# Patient Record
Sex: Female | Born: 1995 | Race: White | Hispanic: No | Marital: Single | Smoking: Current every day smoker
Health system: Southern US, Community
[De-identification: ages and names within clinical notes are randomized; demographics above are authoritative.]

## PROBLEM LIST (undated history)

## (undated) HISTORY — PX: ANKLE SURGERY: SHX546

---
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
## 2015-01-01 DIAGNOSIS — F439 Reaction to severe stress, unspecified: Secondary | ICD-10-CM

## 2015-01-01 DIAGNOSIS — F430 Acute stress reaction: Secondary | ICD-10-CM

## 2015-01-01 MED ORDER — LORAZEPAM 0.5 MG PO TABS
0.5000 mg | ORAL_TABLET | Freq: Three times a day (TID) | ORAL | Status: DC
  Administered 2015-01-01 – 2015-01-02 (×4): 0.5 mg via ORAL
  Filled 2015-01-01 (×4): qty 1

## 2015-01-01 MED ORDER — NICOTINE 21 MG/24HR TD PT24
21.0000 mg | MEDICATED_PATCH | Freq: Every day | TRANSDERMAL | Status: DC
  Administered 2015-01-01: 21 mg via TRANSDERMAL
  Filled 2015-01-01: qty 1

## 2015-01-01 MED ORDER — ACETAMINOPHEN 325 MG PO TABS: 650.0000 mg | ORAL_TABLET | Freq: Four times a day (QID) | ORAL | Status: DC | PRN

## 2015-01-01 MED ORDER — IBUPROFEN 800 MG PO TABS
800.0000 mg | ORAL_TABLET | Freq: Three times a day (TID) | ORAL | Status: AC
  Administered 2015-01-01 (×3): 800 mg via ORAL
  Filled 2015-01-01 (×3): qty 1

## 2015-01-01 MED ORDER — ALUM & MAG HYDROXIDE-SIMETH 200-200-20 MG/5ML PO SUSP: 30.0000 mL | ORAL | Status: DC | PRN

## 2015-01-01 MED ORDER — TRAZODONE HCL 50 MG PO TABS
50.0000 mg | ORAL_TABLET | Freq: Every day | ORAL | Status: DC
  Administered 2015-01-01: 50 mg via ORAL
  Filled 2015-01-01: qty 1

## 2015-01-01 MED ORDER — MAGNESIUM HYDROXIDE 400 MG/5ML PO SUSP: 30.0000 mL | Freq: Every day | ORAL | Status: DC | PRN

## 2015-01-01 NOTE — Progress Notes (Signed)
   01/01/15 1350  Clinical Encounter Type  Visited With Patient  Visit Type Initial;Spiritual support;Psychological support;Behavioral Health  Referral From Nurse  Consult/Referral To Chaplain  Spiritual Encounters  Spiritual Needs Emotional  Stress Factors  Patient Stress Factors Family relationships;Major life changes  Met w/patient who was conversational, but resisted any discussion of depth. Thanked me for the visit and wanted to shake hands. Advised how to contact if any desire for further discussion. Chap. Sonya Pucci G. [place]

## 2015-01-01 NOTE — [facility] Suicide Risk Assessment (Signed)
[facility] INPATIENT:  Family/Significant Other Suicide Prevention Education  Suicide Prevention Education:  Education Completed;Stephanie Marccot [phone] has been identified by the patient as the family member/significant other with whom the patient will be residing, and identified as the person(s) who will aid the patient in the event of a mental health crisis (suicidal ideations/suicide attempt).  With written consent from the patient, the family member/significant other has been provided the following suicide prevention education, prior to the and/or following the discharge of the patient.  The suicide prevention education provided includes the following:  Suicide risk factors  Suicide prevention and interventions  National Suicide Hotline telephone number  [facility] assessment telephone number  [facility] Emergency Assistance 911  [facility] and/or Residential Mobile Crisis Unit telephone number  Request made of family/significant other to:  Remove weapons (e.g., guns, rifles, knives), all items previously/currently identified as safety concern.    Remove drugs/medications (over-the-counter, prescriptions, illicit drugs), all items previously/currently identified as a safety concern.  The family member/significant other verbalizes understanding of the suicide prevention education information provided.  The family member/significant other agrees to remove the items of safety concern listed above.  Miel Wisener L Luke Rigsbee MSW, LCSWA  01/01/2015, 1:01 PM

## 2015-01-01 NOTE — Plan of Care (Signed)
Problem: Alteration in mood; excessive anxiety as evidenced by: Goal: LTG-Patient's behavior demonstrates decreased anxiety (Patient's behavior demonstrates anxiety and he/she is utilizing learned coping skills to deal with anxiety-producing situations)  Outcome: Not Progressing Labile and tearful.  argumentative

## 2015-01-01 NOTE — Progress Notes (Signed)
First part of shift patient labile and very tearful.  Stating that she did not need to be here and that the rules of the unit were stupid.  Refused to take zoloft stating that she did not need it, she was not depressed she had just had a traumatic event.  Verbalized that she did not like having to talk about it.  Unable to verbalize any coping skills at this time.
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
## 2015-01-01 NOTE — B[facility] Group Notes (Signed)
[facility] Group Notes:  (Nursing/MHT/Case Management/Adjunct)  Date:  01/01/2015  Time:  8:59 AM  Type of Therapy:  Psychoeducational Skills  Participation Level:  Did Not Attend    Diana Watts 01/01/2015, 8:59 AM

## 2015-01-01 NOTE — Plan of Care (Signed)
Problem: Ineffective individual coping Goal: LTG: Patient will report a decrease in negative feelings Outcome: Progressing Patient is forthcoming with thoughts and emotions.  Goal: STG: Pt will be able to identify effective and ineffective STG: Pt will be able to identify effective and ineffective coping patterns  Outcome: Not Progressing Pt is paranoid and reports the only thing that would make her feel better is go back to the [facility] as she liked that room better.  Goal: STG: Patient will remain free from self harm Outcome: Progressing No self harm.

## 2015-01-01 NOTE — Plan of Care (Signed)
Problem: Diagnosis: Increased Risk For Suicide Attempt Goal: LTG-Patient Will Show Positive Response to Medication LTG (by discharge) : Patient will show positive response to medication and will participate in the development of the discharge plan.  Outcome: Not Progressing Refuses to take medication.  Verbalizes that she does not need the medication because she is not crazy.

## 2015-01-02 LAB — THYROID PANEL WITH TSH
Free Thyroxine Index: 3.8 (ref 1.2–4.9)
T3 Uptake Ratio: 29 % (ref 24–39)
T4 TOTAL: 13.1 ug/dL — AB (ref 4.5–12.0)
TSH: 1.22 u[IU]/mL (ref 0.450–4.500)

## 2015-01-02 LAB — RPR: RPR: NONREACTIVE

## 2015-01-02 LAB — HIV ANTIBODY (ROUTINE TESTING W REFLEX): HIV SCREEN 4TH GENERATION: NONREACTIVE

## 2015-01-02 MED ORDER — LORAZEPAM 0.5 MG PO TABS: 0.5000 mg | ORAL_TABLET | Freq: Three times a day (TID) | ORAL | Status: AC | PRN

## 2015-01-02 MED ORDER — TRAZODONE HCL 50 MG PO TABS: 50.0000 mg | ORAL_TABLET | Freq: Every day | ORAL | Status: AC

## 2015-01-02 MED ORDER — TRAZODONE HCL 50 MG PO TABS: 50.0000 mg | ORAL_TABLET | Freq: Every day | ORAL | Status: DC

## 2015-01-02 NOTE — [facility] Suicide Risk Assessment (Signed)
[facility]   Demographic Factors:  Adolescent or young adult and Caucasian  Total Time spent with patient: 30 minutes   Psychiatric Specialty Exam: Physical Exam  ROS                                                         Have you used any form of tobacco in the last 30 days? (Cigarettes, Smokeless Tobacco, Cigars, and/or Pipes): Yes  Has this patient used any form of tobacco in the last 30 days? (Cigarettes, Smokeless Tobacco, Cigars, and/or Pipes) Yes, A prescription for an FDA-approved tobacco cessation medication was offered at discharge and the patient refused  Mental Status Per Nursing Assessment::   On Admission:  NA (denies presently)  Current Mental Status by Physician: Denies SI, HI or hallucinations. Denies any thoughts of self-harm, or passive suicidality. She has a supportive family. They have assure that there is no guns in the house .  Loss Factors: Loss of significant relationship  Historical Factors: Impulsivity  Risk Reduction Factors:   Sense of responsibility to family, Living with another person, especially a relative and Positive social support  Continued Clinical Symptoms:  Alcohol/Substance Abuse/Dependencies  Cognitive Features That Contribute To Risk:  None    Suicide Risk:  Minimal: No identifiable suicidal ideation.  Patients presenting with no risk factors but with morbid ruminations; may be classified as minimal risk based on the severity of the depressive symptoms  Principal Problem: Acute stress disorder Discharge Diagnoses:  Patient Active Problem List   Diagnosis Date Noted  . Acute stress disorder [F43.9] 01/01/2015  . Tobacco use disorder [F17.200] 12/31/2014  . 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 Cannabis use disorder, severe, dependence (HCC) [F12.20] 12/31/2014  . Stimulant use disorder (HCC) (cocaine) [F15.90]  12/31/2014  . 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   Why:  Your hospital follow up appointment will be walk in. Walk in hours are Monday- Friday 8:00am - 10:00 am. Please take your ID and any insurance information.    Contact information:   [address] [place] [address] [phone]        Is patient on multiple antipsychotic therapies at discharge:  No   Has Patient had three or more failed trials of antipsychotic monotherapy by history:  No  Recommended Plan for Multiple Antipsychotic Therapies: NA    Jimmy FootmanHernandez-Gonzalez,  Uzziah Rigg 01/02/2015, 11:37 AM

## 2015-01-02 NOTE — Progress Notes (Signed)
Patient to discharge when discharge plan and transportation in place. MD spoke with Mother. Safety maintained.

## 2015-01-02 NOTE — Plan of Care (Signed)
Problem: Ineffective individual coping Goal: LTG: Patient will report a decrease in negative feelings Outcome: Progressing Pt reports she feels better after having slept.  Goal: STG: Patient will remain free from self harm Outcome: Progressing No self harm.

## 2015-01-02 NOTE — Progress Notes (Addendum)
Patient slept thru breakfast. Blaming others because her breakfast tray is gone. Provided with cereal, banana, milk and juice. Denies SI/HI/AVH at this time. Minimal interaction with peers. Verbalizes needs appropriately with staff. Talks with Mother on phone. MD into visit. Safety maintained.

## 2015-01-02 NOTE — Progress Notes (Signed)
Patient discharged at this time. NO SI/HI/AVH at this time. Mother here to transport in private car. Patient verbalizes understanding rt recommended discharge plan of care. Acknowledges all belongings have been returned. Safety maintained.
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
## 2015-06-27 ENCOUNTER — Encounter: Payer: Self-pay | Admitting: *Deleted

## 2015-06-27 ENCOUNTER — Emergency Department: Payer: No Typology Code available for payment source

## 2015-06-27 ENCOUNTER — Emergency Department
Admission: EM | Admit: 2015-06-27 | Discharge: 2015-06-27 | Disposition: A | Discharge status: Home / Self Care | Payer: No Typology Code available for payment source | Attending: Emergency Medicine | Admitting: Emergency Medicine

## 2015-06-27 DIAGNOSIS — F1490 Cocaine use, unspecified, uncomplicated: Secondary | ICD-10-CM | POA: Insufficient documentation

## 2015-06-27 DIAGNOSIS — Y92009 Unspecified place in unspecified non-institutional (private) residence as the place of occurrence of the external cause: Secondary | ICD-10-CM | POA: Insufficient documentation

## 2015-06-27 DIAGNOSIS — Y999 Unspecified external cause status: Secondary | ICD-10-CM | POA: Insufficient documentation

## 2015-06-27 DIAGNOSIS — F17200 Nicotine dependence, unspecified, uncomplicated: Secondary | ICD-10-CM | POA: Insufficient documentation

## 2015-06-27 DIAGNOSIS — Z8659 Personal history of other mental and behavioral disorders: Secondary | ICD-10-CM | POA: Insufficient documentation

## 2015-06-27 DIAGNOSIS — Y939 Activity, unspecified: Secondary | ICD-10-CM | POA: Insufficient documentation

## 2015-06-27 DIAGNOSIS — Z79899 Other long term (current) drug therapy: Secondary | ICD-10-CM | POA: Insufficient documentation

## 2015-06-27 DIAGNOSIS — S0101XA Laceration without foreign body of scalp, initial encounter: Secondary | ICD-10-CM | POA: Insufficient documentation

## 2015-06-27 LAB — POC URINE PREG, ED

## 2015-06-27 MED ORDER — LIDOCAINE-EPINEPHRINE (PF) 1 %-1:200000 IJ SOLN
INTRAMUSCULAR | Status: AC
  Filled 2015-06-27: qty 30
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
## 2015-06-27 NOTE — Discharge Instructions (Signed)
General Assault °Assault includes any behavior or physical attack--whether it is on purpose or not--that results in injury to another person, damage to property, or both. This also includes assault that has not yet happened, but is planned to happen. Threats of assault may be physical, verbal, or written. They may be said or sent by: °· Mail. °· E-mail. °· Text. °· Social media. °· Fax. °The threats may be direct, implied, or understood. °WHAT ARE THE DIFFERENT FORMS OF ASSAULT? °Forms of assault include: °· Physically assaulting a person. This includes physical threats to inflict physical harm as well as: °· Slapping. °· Hitting. °· Poking. °· Kicking. °· Punching. °· Pushing. °· Sexually assaulting a person. Sexual assault is any sexual activity that a person is forced, threatened, or coerced to participate in. It may or may not involve physical contact with the person who is assaulting you. You are sexually assaulted if you are forced to have sexual contact of any kind. °· Damaging or destroying a person's assistive equipment, such as glasses, canes, or walkers. °· Throwing or hitting objects. °· Using or displaying a weapon to harm or threaten someone. °· Using or displaying an object that appears to be a weapon in a threatening manner. °· Using greater physical size or strength to intimidate someone. °· Making intimidating or threatening gestures. °· Bullying. °· Hazing. °· Using language that is intimidating, threatening, hostile, or abusive. °· Stalking. °· Restraining someone with force. °WHAT SHOULD I DO IF I EXPERIENCE ASSAULT? °· Report assaults, threats, and stalking to the police. Call your local emergency services (911 in the [place] if you are in immediate danger or you need medical help.  °· You can work with a lawyer or an advocate to get legal protection against someone who has assaulted you or threatened you with assault. Protection includes restraining orders and private addresses. Crimes against  you, such as assault, can also be prosecuted through the courts. Laws will vary depending on where you live. °  °This information is not intended to replace advice given to you by your health care provider. Make sure you discuss any questions you have with your health care provider. °  °Document Released: 12/25/2004 Document Revised: 01/15/2014 Document Reviewed: 09/11/2013 °Elsevier Interactive Patient Education ©2016 [employer] ° °Stitches, Staples, or Adhesive Wound Closure °Health care providers use stitches (sutures), staples, and certain glue (skin adhesives) to hold skin together while it heals (wound closure). You may need this treatment after you have surgery or if you cut your skin accidentally. These methods help your skin to heal more quickly and make it less likely that you will have a scar. A wound may take several months to heal completely. °The type of wound you have determines when your wound gets closed. In most cases, the wound is closed as soon as possible (primary skin closure). Sometimes, closure is delayed so the wound can be cleaned and allowed to heal naturally. This reduces the chance of infection. Delayed closure may be needed if your wound: °· Is caused by a bite. °· Happened more than 6 hours ago. °· Involves loss of skin or the tissues under the skin. °· Has dirt or debris in it that cannot be removed. °· Is infected. °WHAT ARE THE DIFFERENT KINDS OF WOUND CLOSURES? °There are many options for wound closure. The one that your health care provider uses depends on how deep and how large your wound is. °Adhesive Glue °To use this type of glue to close a wound,   your health care provider holds the edges of the wound together and paints the glue on the surface of your skin. You may need more than one layer of glue. Then the wound may be covered with a light bandage (dressing). This type of skin closure may be used for small wounds that are not deep (superficial). Using glue for wound  closure is less painful than other methods. It does not require a medicine that numbs the area (local anesthetic). This method also leaves nothing to be removed. Adhesive glue is often used for children and on facial wounds. Adhesive glue cannot be used for wounds that are deep, uneven, or bleeding. It is not used inside of a wound.  Adhesive Strips These strips are made of sticky (adhesive), porous paper. They are applied across your skin edges like a regular adhesive bandage. You leave them on until they fall off. Adhesive strips may be used to close very superficial wounds. They may also be used along with sutures to improve the closure of your skin edges.  Sutures Sutures are the oldest method of wound closure. Sutures can be made from natural substances, such as silk, or from synthetic materials, such as nylon and steel. They can be made from a material that your body can break down as your wound heals (absorbable), or they can be made from a material that needs to be removed from your skin (nonabsorbable). They come in many different strengths and sizes. Your health care provider attaches the sutures to a steel needle on one end. Sutures can be passed through your skin, or through the tissues beneath your skin. Then they are tied and cut. Your skin edges may be closed in one continuous stitch or in separate stitches. Sutures are strong and can be used for all kinds of wounds. Absorbable sutures may be used to close tissues under the skin. The disadvantage of sutures is that they may cause skin reactions that lead to infection. Nonabsorbable sutures need to be removed. Staples When surgical staples are used to close a wound, the edges of your skin on both sides of the wound are brought close together. A staple is placed across the wound, and an instrument secures the edges together. Staples are often used to close surgical cuts (incisions). Staples are faster to use than sutures, and they cause less  skin reaction. Staples need to be removed using a tool that bends the staples away from your skin. HOW DO I CARE FOR MY WOUND CLOSURE?  Take medicines only as directed by your health care provider.  If you were prescribed an antibiotic medicine for your wound, finish it all even if you start to feel better.  Use ointments or creams only as directed by your health care provider.  Wash your hands with soap and water before and after touching your wound.  Do not soak your wound in water. Do not take baths, swim, or use a hot tub until your health care provider approves.  Ask your health care provider when you can start showering. Cover your wound if directed by your health care provider.  Do not take out your own sutures or staples.  Do not pick at your wound. Picking can cause an infection.  Keep all follow-up visits as directed by your health care provider. This is important. HOW LONG WILL I HAVE MY WOUND CLOSURE?  Leave adhesive glue on your skin until the glue peels away.  Leave adhesive strips on your skin until the strips  fall off. °· Absorbable sutures will dissolve within several days. °· Nonabsorbable sutures and staples must be removed. The location of the wound will determine how long they stay in. This can range from several days to a couple of weeks. °WHEN SHOULD I SEEK HELP FOR MY WOUND CLOSURE? °Contact your health care provider if: °· You have a fever. °· You have chills. °· You have drainage, redness, swelling, or pain at your wound. °· There is a bad smell coming from your wound. °· The skin edges of your wound start to separate after your sutures have been removed. °· Your wound becomes thick, raised, and darker in color after your sutures come out (scarring). °  °This information is not intended to replace advice given to you by your health care provider. Make sure you discuss any questions you have with your health care provider. °  °Document Released: 09/19/2000 Document  Revised: 01/15/2014 Document Reviewed: 06/03/2013 °Elsevier Interactive Patient Education ©2016 [employer]vier Inc. ° °

## 2016-08-10 ENCOUNTER — Ambulatory Visit
Admission: EM | Admit: 2016-08-10 | Discharge: 2016-08-10 | Disposition: A | Discharge status: Home / Self Care | Payer: BLUE CROSS/BLUE SHIELD | Attending: Family Medicine | Admitting: Family Medicine

## 2016-08-10 DIAGNOSIS — N3001 Acute cystitis with hematuria: Secondary | ICD-10-CM | POA: Diagnosis not present

## 2016-08-10 LAB — PREGNANCY, URINE: PREG TEST UR: NEGATIVE

## 2016-08-10 LAB — URINALYSIS, COMPLETE (UACMP) WITH MICROSCOPIC
Glucose, UA: NEGATIVE mg/dL
NITRITE: POSITIVE — AB
PH: 5.5 (ref 5.0–8.0)
Protein, ur: 30 mg/dL — AB
SPECIFIC GRAVITY, URINE: 1.025 (ref 1.005–1.030)

## 2016-08-10 MED ORDER — SULFAMETHOXAZOLE-TRIMETHOPRIM 800-160 MG PO TABS: 1.0000 | ORAL_TABLET | Freq: Two times a day (BID) | ORAL | 0 refills | Status: AC

## 2016-08-10 MED ORDER — ONDANSETRON 8 MG PO TBDP: 8.0000 mg | ORAL_TABLET | Freq: Three times a day (TID) | ORAL | 0 refills | Status: AC | PRN

## 2016-08-10 NOTE — ED Triage Notes (Signed)
Patient complains of low back pain on left side and towards the middle. Patient reports that pain is worse at flank. Patient states that she has been having urinary frequency and abdominal pain. Patient states that she had this a while back on the right side and improved on its own.

## 2016-08-10 NOTE — ED Provider Notes (Signed)
[facility]    CSN: [id number] Arrival date & time: 08/10/16  1531     History   Chief Complaint Chief Complaint  Patient presents with  . Urinary Frequency    HPI Diana Watts is a 21 y.o. female.   The history is provided by the patient.  Urinary Frequency  This is a new problem. The current episode started yesterday. The problem occurs constantly. The problem has been gradually worsening. Associated symptoms include abdominal pain (left flank and associated with nausea but no vomiting or diarrhea).  Dysuria  Associated symptoms: abdominal pain (left flank and associated with nausea but no vomiting or diarrhea)     History reviewed. No pertinent past medical history.  Patient Active Problem List   Diagnosis Date Noted  . Acute stress disorder 01/01/2015  . Tobacco use disorder 12/31/2014  . 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 Cannabis use disorder, severe, dependence (HCC) 12/31/2014  . Stimulant use disorder (HCC) (cocaine) 12/31/2014  . 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 ANKLE SURGERY Right     OB History    No data available       Home Medications    Prior to Admission medications   Medication Sig Start Date End Date Taking? Authorizing Provider  LORazepam (ATIVAN) 0.5 MG tablet Take 1 tablet (0.5 mg total) by mouth every 8 (eight) hours as needed for anxiety. 01/02/15   Jimmy FootmanHernandez-Gonzalez, Andrea, MD  ondansetron (ZOFRAN ODT) 8 MG disintegrating tablet Take 1 tablet (8 mg total) by mouth every 8 (eight) hours as needed. 08/10/16   Payton Mccallumonty, Tyler Cubit, MD  sulfamethoxazole-trimethoprim (BACTRIM DS,SEPTRA DS) 800-160 MG tablet Take 1 tablet by mouth 2 (two) times daily. 08/10/16   Payton Mccallumonty, Kadeshia Kasparian, MD  traZODone (DESYREL) 50 MG tablet Take 1 tablet (50 mg total) by mouth at bedtime. Ok to take 1 to 2 tabs at bedtime 01/02/15    Jimmy FootmanHernandez-Gonzalez, Andrea, MD    Family History History reviewed. No pertinent family history.  Social History Social History  Substance Use Topics  . Smoking status: Current Every Day Smoker    Packs/day: 1.00  . Smokeless tobacco: Never Used  . Alcohol use Yes     Comment: occasionally     Allergies   Patient has no known allergies.   Review of Systems Review of Systems  Gastrointestinal: Positive for abdominal pain (left flank and associated with nausea but no vomiting or diarrhea).  Genitourinary: Positive for dysuria and frequency.     Physical Exam Triage Vital Signs ED Triage Vitals  Enc Vitals Group     BP 08/10/16 1559 133/64     Pulse Rate 08/10/16 1559 (!) 114     Resp 08/10/16 1559 18     Temp 08/10/16 1559 100 F (37.8 C)     Temp Source 08/10/16 1559 Oral     SpO2 08/10/16 1559 100 %     Weight 08/10/16 1556 200 lb (90.7 kg)     Height 08/10/16 1556 5\' 6"  (1.676 m)     Head Circumference --      Peak Flow --      Pain Score 08/10/16 1556 7     Pain Loc --      Pain Edu? --      Excl. in GC? --    No data found.   Updated Vital Signs BP 133/64 (BP Location: Left Arm)   Pulse Marland Kitchen(!)  114   Temp 100 F (37.8 C) (Oral)   Resp 18   Ht 5\' 6"  (1.676 m)   Wt 200 lb (90.7 kg)   LMP 08/03/2016   SpO2 100%   BMI 32.28 kg/m   Visual Acuity Right Eye Distance:   Left Eye Distance:   Bilateral Distance:    Right Eye Near:   Left Eye Near:    Bilateral Near:     Physical Exam  Constitutional: She appears well-developed and well-nourished. No distress.  Abdominal: Soft. Bowel sounds are normal. She exhibits no distension and no mass. There is tenderness (mild left flank and suprapubic tenderness to palpation; no rebound or guarding). There is no rebound and no guarding.  Skin: She is not diaphoretic.  Nursing note and vitals reviewed.    UC Treatments / Results  Labs (all labs ordered are listed, but only abnormal results are  displayed) Labs Reviewed  URINALYSIS, COMPLETE (UACMP) WITH MICROSCOPIC - Abnormal; Notable for the following:       Result Value   Color, Urine AMBER (*)    APPearance CLOUDY (*)    Hgb urine dipstick LARGE (*)    Bilirubin Urine SMALL (*)    Ketones, ur TRACE (*)    Protein, ur 30 (*)    Nitrite POSITIVE (*)    Leukocytes, UA MODERATE (*)    Squamous Epithelial / LPF 6-30 (*)    Bacteria, UA MANY (*)    All other components within normal limits  PREGNANCY, URINE    EKG  EKG Interpretation None       Radiology No results found.  Procedures Procedures (including critical care time)  Medications Ordered in UC Medications - No data to display   Initial Impression / Assessment and Plan / UC Course  I have reviewed the triage vital signs and the nursing notes.  Pertinent labs & imaging results that were available during my care of the patient were reviewed by me and considered in my medical decision making (see chart for details).       Final Clinical Impressions(s) / UC Diagnoses   Final diagnoses:  Acute cystitis with hematuria    New Prescriptions New Prescriptions   ONDANSETRON (ZOFRAN ODT) 8 MG DISINTEGRATING TABLET    Take 1 tablet (8 mg total) by mouth every 8 (eight) hours as needed.   SULFAMETHOXAZOLE-TRIMETHOPRIM (BACTRIM DS,SEPTRA DS) 800-160 MG TABLET    Take 1 tablet by mouth 2 (two) times daily.   1. Lab results and diagnosis reviewed with patient 2. rx as per orders above; reviewed possible side effects, interactions, risks and benefits  3. Recommend supportive treatment with increased fluids, otc analgesics 4. Follow-up prn if symptoms worsen or don't improve   Payton Mccallumonty, Beonka Amesquita, MD 08/10/16 1655

## 2016-08-13 LAB — URINE CULTURE: Special Requests: NORMAL

## 2017-12-05 ENCOUNTER — Other Ambulatory Visit: Payer: Self-pay

## 2017-12-05 ENCOUNTER — Emergency Department: Payer: Self-pay

## 2017-12-05 ENCOUNTER — Encounter: Payer: Self-pay | Admitting: Emergency Medicine

## 2017-12-05 ENCOUNTER — Emergency Department
Admission: EM | Admit: 2017-12-05 | Discharge: 2017-12-05 | Disposition: A | Discharge status: Home / Self Care | Payer: Self-pay | Attending: Emergency Medicine | Admitting: Emergency Medicine

## 2017-12-05 DIAGNOSIS — R569 Unspecified convulsions: Secondary | ICD-10-CM | POA: Insufficient documentation

## 2017-12-05 DIAGNOSIS — F17200 Nicotine dependence, unspecified, uncomplicated: Secondary | ICD-10-CM | POA: Insufficient documentation

## 2017-12-05 DIAGNOSIS — Z79899 Other long term (current) drug therapy: Secondary | ICD-10-CM | POA: Insufficient documentation

## 2017-12-05 LAB — COMPREHENSIVE METABOLIC PANEL
ALBUMIN: 4.5 g/dL (ref 3.5–5.0)
ALT: 36 U/L (ref 0–44)
AST: 27 U/L (ref 15–41)
Alkaline Phosphatase: 64 U/L (ref 38–126)
Anion gap: 13 (ref 5–15)
BILIRUBIN TOTAL: 0.6 mg/dL (ref 0.3–1.2)
BUN: 11 mg/dL (ref 6–20)
CHLORIDE: 103 mmol/L (ref 98–111)
CO2: 23 mmol/L (ref 22–32)
CREATININE: 0.86 mg/dL (ref 0.44–1.00)
Calcium: 9.9 mg/dL (ref 8.9–10.3)
GFR calc Af Amer: >60 mL/min (ref >60)
GFR calc non Af Amer: >60 mL/min (ref >60)
GLUCOSE: 142 mg/dL — AB (ref 70–99)
POTASSIUM: 3.5 mmol/L (ref 3.5–5.1)
Sodium: 139 mmol/L (ref 135–145)
TOTAL PROTEIN: 8.3 g/dL — AB (ref 6.5–8.1)

## 2017-12-05 LAB — CBC
HEMATOCRIT: 41.8 % (ref 36.0–46.0)
Hemoglobin: 13.7 g/dL (ref 12.0–15.0)
MCH: 28.9 pg (ref 26.0–34.0)
MCHC: 32.8 g/dL (ref 30.0–36.0)
MCV: 88.2 fL (ref 80.0–100.0)
NRBC: 0 % (ref 0.0–0.2)
Platelets: 448 10*3/uL — ABNORMAL HIGH (ref 150–400)
RBC: 4.74 MIL/uL (ref 3.87–5.11)
RDW: 12.9 % (ref 11.5–15.5)
WBC: 9.3 10*3/uL (ref 4.0–10.5)

## 2017-12-05 MED ORDER — BACITRACIN ZINC 500 UNIT/GM EX OINT
TOPICAL_OINTMENT | Freq: Two times a day (BID) | CUTANEOUS | Status: DC
  Administered 2017-12-05: 11:00:00 via TOPICAL

## 2017-12-05 MED ORDER — BACITRACIN ZINC 500 UNIT/GM EX OINT
TOPICAL_OINTMENT | CUTANEOUS | Status: AC
  Filled 2017-12-05: qty 0.9

## 2017-12-05 NOTE — ED Triage Notes (Signed)
Patient from home via [facility]. Per EMS, patient reports seizure-like activity and loss of consciousness that lasted approximately 3-4 minutes. Patient has no history of seizures. Patient alert and oriented upon EMS arrival with no incontinence or injury to tongue noted. Family reports patient has not been getting much sleep lately and are concerned for possible drug use. Patient reports last cocaine use last week. Alert and oriented upon arrival.

## 2017-12-05 NOTE — ED Provider Notes (Signed)
[facility] Emergency Department Provider Note   ____________________________________________    I have reviewed the triage vital signs and the nursing notes.   HISTORY  Chief Complaint Seizures     HPI Diana Watts is a 22 y.o. female who presents after possible seizure.  Patient does not remember the event.  She reports she is feeling well this morning and the next thing that she remembers is that she was lying on the floor and EMS arrived to take her to the emergency department.  Currently she feels well and has no complaints except for some mild soreness of her right elbow where she has an abrasion.  Denies headache.  No neuro deficits.  No history of seizures.  Does admit to cocaine use but only last week, denies any recent drugs.   History reviewed. No pertinent past medical history.  Patient Active Problem List   Diagnosis Date Noted  . Acute stress disorder 01/01/2015  . Tobacco use disorder 12/31/2014  . 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 Cannabis use disorder, severe, dependence (HCC) 12/31/2014  . Stimulant use disorder (HCC) (cocaine) 12/31/2014  . 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 ANKLE SURGERY Right     Prior to Admission medications   Medication Sig Start Date End Date Taking? Authorizing Provider  LORazepam (ATIVAN) 0.5 MG tablet Take 1 tablet (0.5 mg total) by mouth every 8 (eight) hours as needed for anxiety. 01/02/15   Jimmy Footman, MD  ondansetron (ZOFRAN ODT) 8 MG disintegrating tablet Take 1 tablet (8 mg total) by mouth every 8 (eight) hours as needed. 08/10/16   Payton Mccallum, MD  sulfamethoxazole-trimethoprim (BACTRIM DS,SEPTRA DS) 800-160 MG tablet Take 1 tablet by mouth 2 (two) times daily. 08/10/16   Payton Mccallum, MD  traZODone (DESYREL) 50 MG tablet Take 1 tablet (50 mg total)  by mouth at bedtime. Ok to take 1 to 2 tabs at bedtime 01/02/15   Jimmy Footman, MD     Allergies Patient has no known allergies.  No family history on file.  Social History Social History   Tobacco Use  . Smoking status: Current Every Day Smoker    Packs/day: 1.00  . Smokeless tobacco: Never Used  Substance Use Topics  . Alcohol use: Yes    Comment: occasionally  . Drug use: Yes    Types: Cocaine    Comment: Molly    Review of Systems  Constitutional: No fever/chills Eyes: No visual changes.  ENT: No neck pain Cardiovascular: No palpitations Respiratory: Denies shortness of breath.  No pleurisy Gastrointestinal: No nausea, no vomiting.   Genitourinary: Negative for dysuria. Musculoskeletal: Negative for back pain. Skin: Abrasion right elbow Neurological: Negative for headaches or focal weakness   ____________________________________________   PHYSICAL EXAM:  VITAL SIGNS: ED Triage Vitals  Enc Vitals Group     BP 12/05/17 0921 (!) 142/84     Pulse Rate 12/05/17 0921 (!) 123     Resp --      Temp 12/05/17 0921 98.3 F (36.8 C)     Temp Source 12/05/17 0921 Oral     SpO2 12/05/17 0921 100 %     Weight 12/05/17 0922 81.6 kg (180 lb)     Height 12/05/17 0922 1.676 m (5\' 6" )     Head Circumference --      Peak Flow --      Pain Score 12/05/17 0922 0  Pain Loc --      Pain Edu? --      Excl. in GC? --     Constitutional: Alert and oriented. No acute distress.  Eyes: Conjunctivae are normal.  PERRLA Head: Atraumatic. Nose: No swelling or epistaxis Mouth/Throat: Mucous membranes are moist.   Neck:  Painless ROM, no vertebral chest palpation Cardiovascular: Normal rate, regular rhythm. Grossly normal heart sounds.  Good peripheral circulation. Respiratory: Normal respiratory effort.  No retractions. Lungs CTAB. Gastrointestinal: Soft and nontender. No distention. Musculoskeletal: No lower extremity tenderness nor edema.  Warm and well  perfused Neurologic:  Normal speech and language. No gross focal neurologic deficits are appreciated.  Skin:  Skin is warm, dry and intact.  2 x 2 centimeter circular mild abrasion to the right elbow Psychiatric: Mood and affect are normal. Speech and behavior are normal.  ____________________________________________   LABS (all labs ordered are listed, but only abnormal results are displayed)  Labs Reviewed  CBC - Abnormal; Notable for the following components:      Result Value   Platelets 448 (*)    All other components within normal limits  COMPREHENSIVE METABOLIC PANEL - Abnormal; Notable for the following components:   Glucose, Bld 142 (*)    Total Protein 8.3 (*)    All other components within normal limits  URINE DRUG SCREEN, QUALITATIVE ([facility] ONLY)   ____________________________________________  EKG  ED ECG REPORT I, Jene Everyobert Korynne Dols, the attending physician, personally viewed and interpreted this ECG.  Date: 12/05/2017  Rhythm: normal sinus rhythm QRS Axis: normal Intervals: normal ST/T Wave abnormalities: normal Narrative Interpretation: no evidence of acute ischemia  ____________________________________________  RADIOLOGY  CT head normal ____________________________________________   PROCEDURES  Procedure(s) performed: No  Procedures   Critical Care performed:No ____________________________________________   INITIAL IMPRESSION / ASSESSMENT AND PLAN / ED COURSE  Pertinent labs & imaging results that were available during my care of the patient were reviewed by me and considered in my medical decision making (see chart for details).  Patient presents after possible seizure although details are not clear.  Family is in route.  No history of seizures.  Does admit to drug use but denies recent drug abuse.  Will obtain labs, urine drug screen, POCT pregnancy, sent for CT head and reevaluate.  ----------------------------------------- 9:58 AM on  12/05/2017 -----------------------------------------  Spoke with mother who reports the patient was in and out of the house all night long and she is strongly suspicious for drug use  ----------------------------------------- 11:22 AM on 12/05/2017 -----------------------------------------  Patient remains comfortable, well-appearing, no further seizure activity.  CT is normal.  She is anxious to leave as she and her family are traveling to [place] does not want to give a urine sample at this time, I have communicated to her that she is not to drive until cleared by neurology.  Neurology follow-up is indicated.    ____________________________________________   FINAL CLINICAL IMPRESSION(S) / ED DIAGNOSES  Final diagnoses:  Seizure (HCC)        Note:  This document was prepared using Dragon voice recognition software and may include unintentional dictation errors.    Jene EveryKinner, Muriel Hannold, MD 12/05/17 1122

## 2017-12-05 NOTE — Discharge Instructions (Signed)
You are not to drive until cleared by neurology

## 2017-12-05 NOTE — ED Notes (Signed)
Patient with small abrasions noted to right elbow. Abrasions cleaned with saline. Bacitracin and bandaids applied.

## 2019-08-15 IMAGING — CT CT HEAD W/O CM
3 series · 15 of 44 positions shown, 18 images · non-contrast
Comparison: 06/27/2015

CLINICAL DATA: Recent seizure activity

EXAM:
CT HEAD WITHOUT CONTRAST
TECHNIQUE: Contiguous axial images were obtained from the base of the skull
through the vertex without intravenous contrast.

[Series 2: head wo · axial · 0.40mm/px · z∈[+240,+350]mm · 9 of 27 slices shown, 12 images]
[im 3/27  brain]
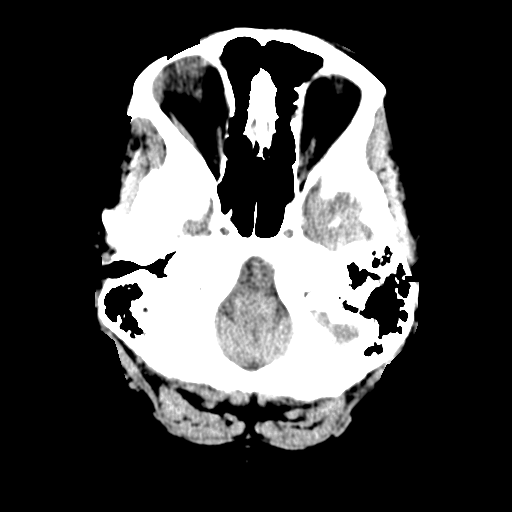
[im 3/27  bone]
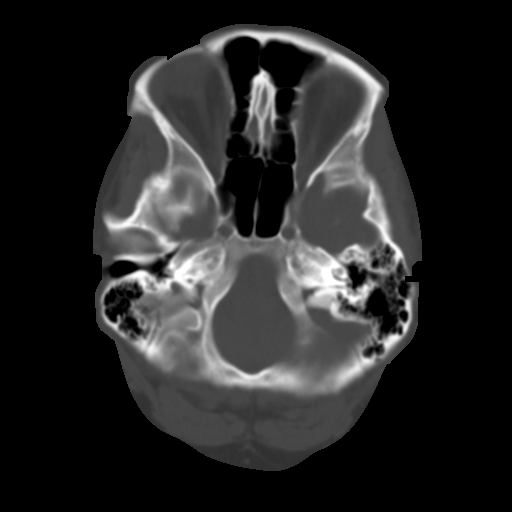
[im 6/27  brain]
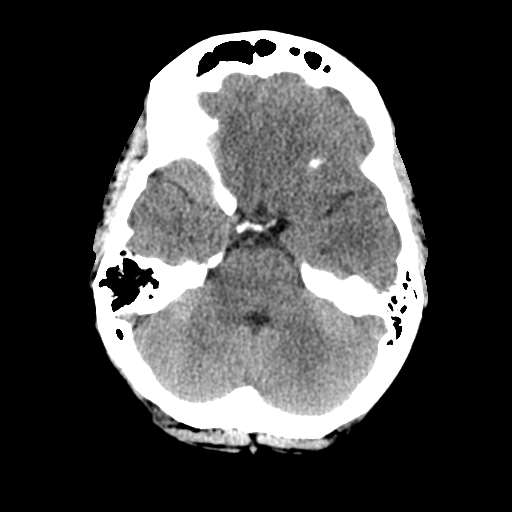
[im 8/27  brain]
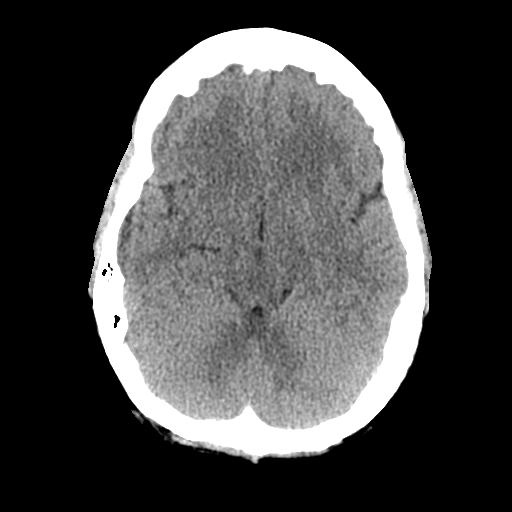
[im 11/27  brain]
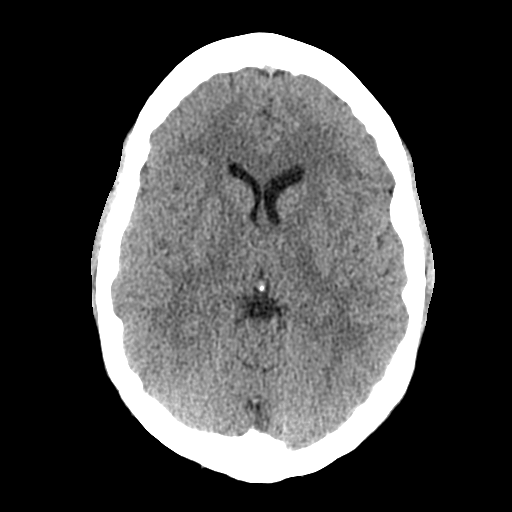
[im 14/27  brain]
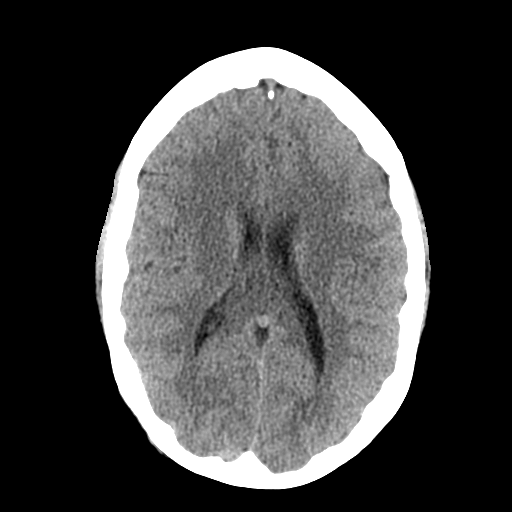
[im 14/27  bone]
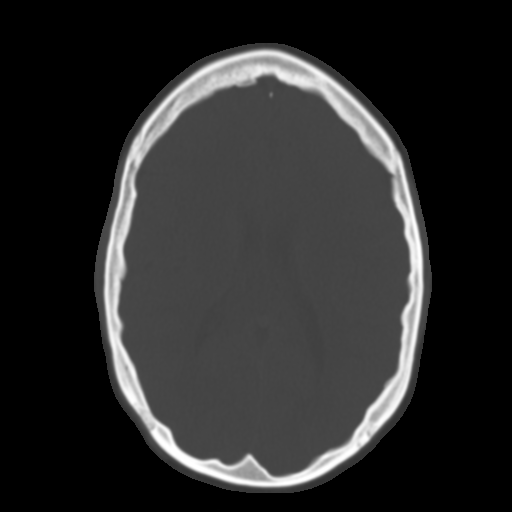
[im 17/27  brain]
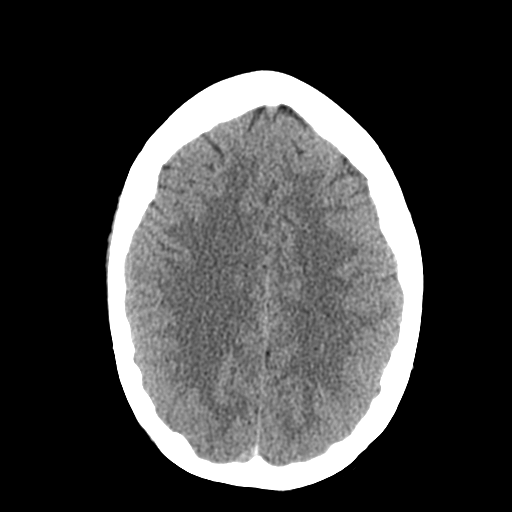
[im 20/27  brain]
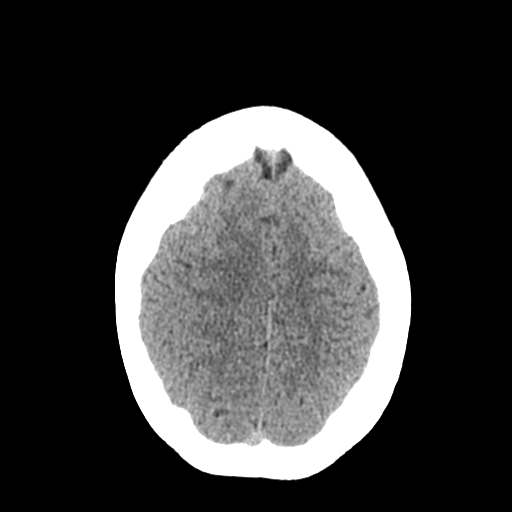
[im 22/27  brain]
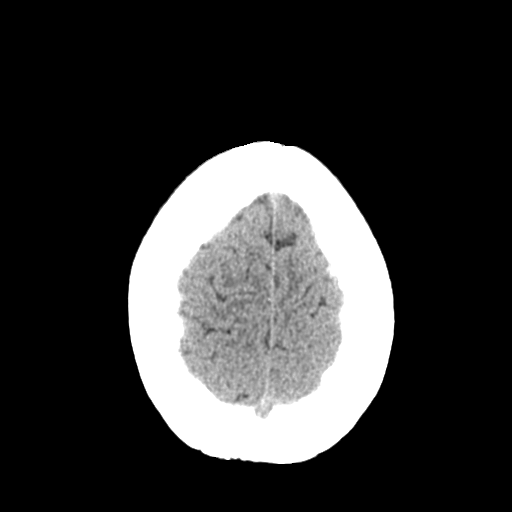
[im 25/27  brain]
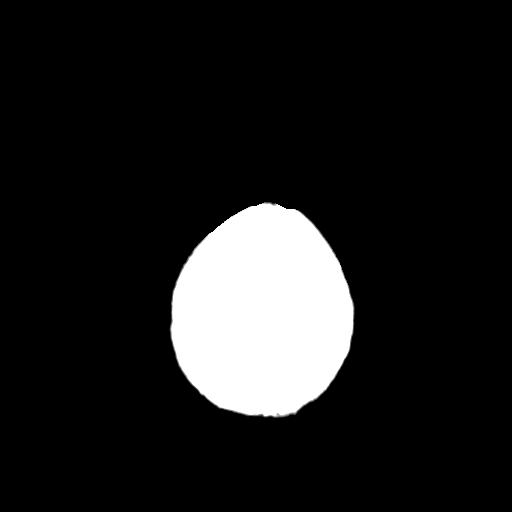
[im 25/27  bone]
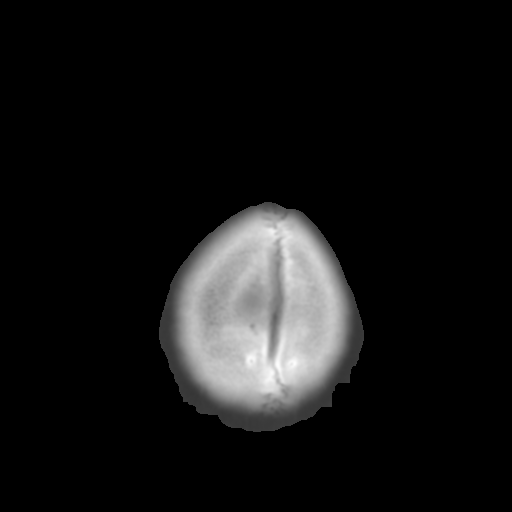

[Series 4: coronal soft tissue · coronal · 0.26mm/px · 3 of 65 slices shown]
[im 22/65  brain]
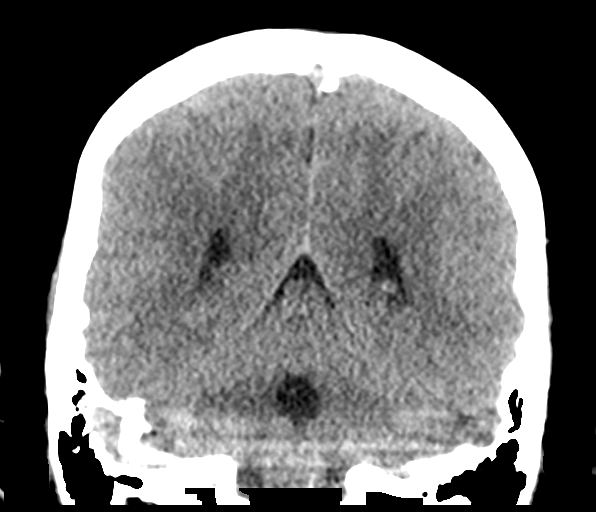
[im 29/65  brain]
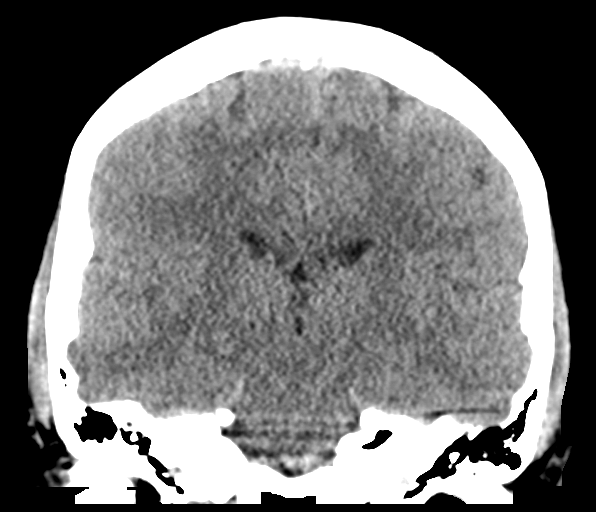
[im 36/65  brain]
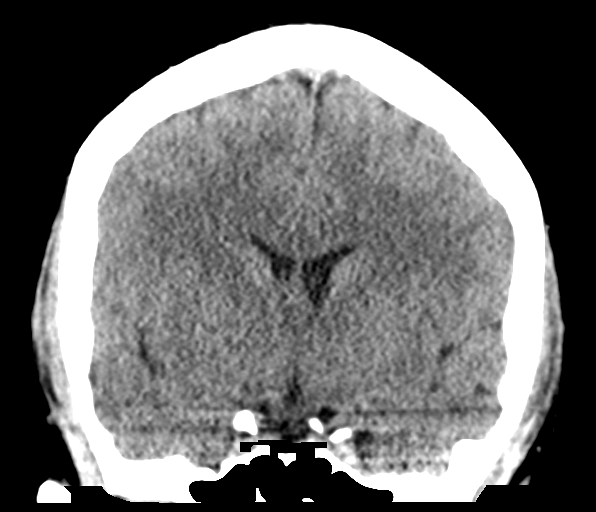

[Series 5: sagittal soft tissue · sagittal · 0.27mm/px · 3 of 52 slices shown]
[im 18/52  brain]
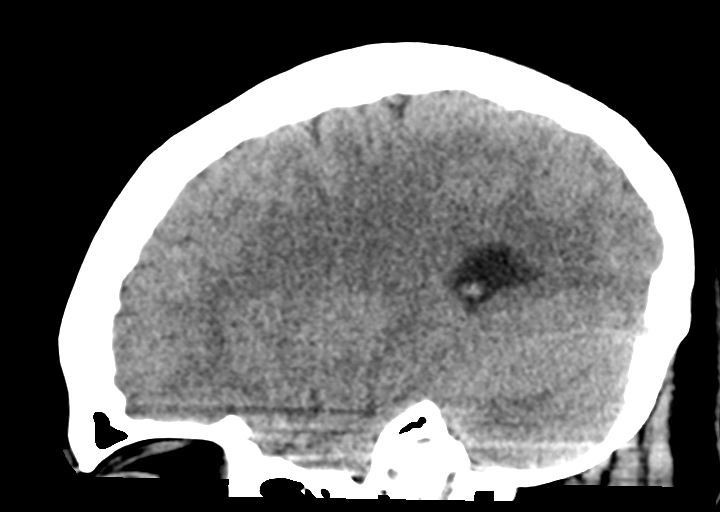
[im 26/52  brain]
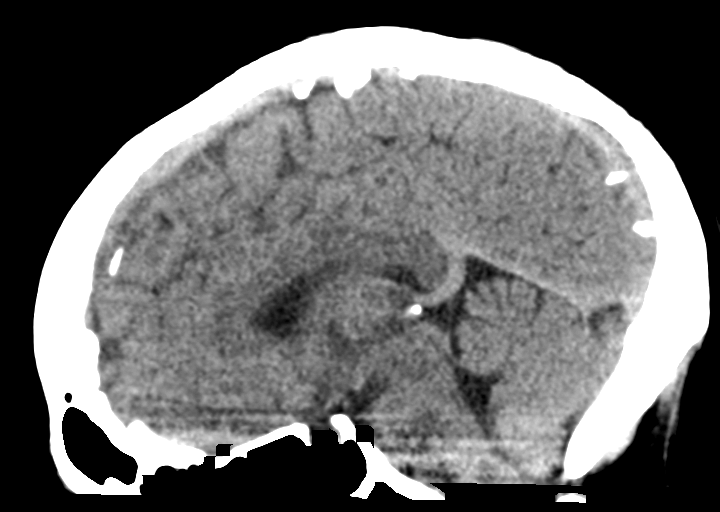
[im 35/52  brain]
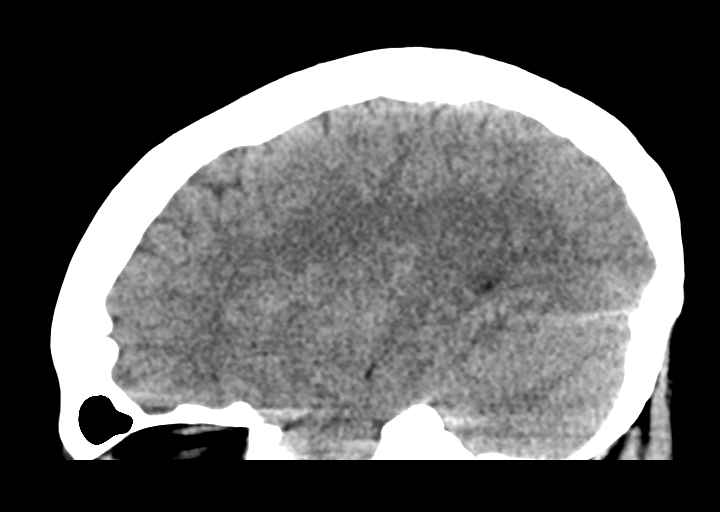

[15 of 44 positions shown; findings below may reference images not displayed]

FINDINGS: Brain: No evidence of acute infarction, hemorrhage, hydrocephalus,
extra-axial collection or mass lesion/mass effect.

Vascular: No hyperdense vessel or unexpected calcification.

Skull: Normal. Negative for fracture or focal lesion.

Sinuses/Orbits: No acute finding.

Other: None.
IMPRESSION: Normal head CT
